# Patient Record
Sex: Male | Born: 1970
Health system: Southern US, Community
[De-identification: ages and names within clinical notes are randomized; demographics above are authoritative.]

---

## 2014-05-11 ENCOUNTER — Emergency Department: Payer: Self-pay | Admitting: Student

## 2018-09-22 ENCOUNTER — Emergency Department
Admission: EM | Admit: 2018-09-22 | Discharge: 2018-09-22 | Disposition: A | Discharge status: Home / Self Care | Payer: Managed Care, Other (non HMO) | Attending: Student in an Organized Health Care Education/Training Program | Admitting: Student in an Organized Health Care Education/Training Program

## 2018-09-22 ENCOUNTER — Other Ambulatory Visit: Payer: Self-pay

## 2018-09-22 ENCOUNTER — Encounter: Payer: Self-pay | Admitting: Emergency Medicine

## 2018-09-22 DIAGNOSIS — Y9368 Activity, volleyball (beach) (court): Secondary | ICD-10-CM | POA: Diagnosis not present

## 2018-09-22 DIAGNOSIS — Y929 Unspecified place or not applicable: Secondary | ICD-10-CM | POA: Insufficient documentation

## 2018-09-22 DIAGNOSIS — S8991XA Unspecified injury of right lower leg, initial encounter: Secondary | ICD-10-CM | POA: Diagnosis present

## 2018-09-22 DIAGNOSIS — S86911A Strain of unspecified muscle(s) and tendon(s) at lower leg level, right leg, initial encounter: Secondary | ICD-10-CM | POA: Diagnosis not present

## 2018-09-22 DIAGNOSIS — Y33XXXA Other specified events, undetermined intent, initial encounter: Secondary | ICD-10-CM | POA: Insufficient documentation

## 2018-09-22 DIAGNOSIS — Y998 Other external cause status: Secondary | ICD-10-CM | POA: Insufficient documentation

## 2018-09-22 MED ORDER — KETOROLAC TROMETHAMINE 60 MG/2ML IM SOLN
60.0000 mg | Freq: Once | INTRAMUSCULAR | Status: AC
  Administered 2018-09-22: 60 mg via INTRAMUSCULAR
  Filled 2018-09-22: qty 2

## 2018-09-22 MED ORDER — ORPHENADRINE CITRATE 30 MG/ML IJ SOLN
60.0000 mg | Freq: Two times a day (BID) | INTRAMUSCULAR | Status: DC
  Administered 2018-09-22: 60 mg via INTRAMUSCULAR
  Filled 2018-09-22: qty 2

## 2018-09-22 MED ORDER — OXYCODONE-ACETAMINOPHEN 7.5-325 MG PO TABS: 1.0000 | ORAL_TABLET | Freq: Four times a day (QID) | ORAL | 0 refills | Status: DC | PRN

## 2018-09-22 MED ORDER — HYDROMORPHONE HCL 1 MG/ML IJ SOLN
1.0000 mg | Freq: Once | INTRAMUSCULAR | Status: AC
  Administered 2018-09-22: 1 mg via INTRAMUSCULAR
  Filled 2018-09-22: qty 1

## 2018-09-22 MED ORDER — IBUPROFEN 600 MG PO TABS: 600.0000 mg | ORAL_TABLET | Freq: Three times a day (TID) | ORAL | 0 refills | Status: DC | PRN

## 2018-09-22 MED ORDER — CYCLOBENZAPRINE HCL 10 MG PO TABS: 10.0000 mg | ORAL_TABLET | Freq: Three times a day (TID) | ORAL | 0 refills | Status: DC | PRN

## 2018-09-22 NOTE — ED Triage Notes (Signed)
C/O left upper leg pain.  STates injured leg while playing volley ball yesterday.  STates pain worse when sitting.

## 2018-09-22 NOTE — ED Notes (Signed)

## 2018-09-22 NOTE — ED Notes (Signed)
See triage note  Presents with pain to left leg  Pain started while playing volley ball yesterday  Pain is mainly to upper leg  Pain increases with sitting  Ambulates well

## 2018-09-22 NOTE — ED Provider Notes (Signed)
[facility] Emergency Department Provider Note   ____________________________________________   First MD Initiated Contact with Patient 09/22/18 1043     (approximate)  I have reviewed the triage vital signs and the nursing notes.   HISTORY  Chief Complaint Leg Injury    HPI Stephen Weaver is a 48 y.o. male patient complain of posterior right thigh pain secondary to a hyperextension incident yesterday while playing volleyball.  Patient states pain increases with sitting.   Patient rates the pain a 6/10.  Patient described the pain as "achy/sore".  No palliative measures for complaint.     History reviewed. No pertinent past medical history.  There are no active problems to display for this patient.   History reviewed. No pertinent surgical history.  Prior to Admission medications   Medication Sig Start Date End Date Taking? Authorizing Provider  cyclobenzaprine (FLEXERIL) 10 MG tablet Take 1 tablet (10 mg total) by mouth 3 (three) times daily as needed. 09/22/18   Joni Reining, PA-C  ibuprofen (ADVIL,MOTRIN) 600 MG tablet Take 1 tablet (600 mg total) by mouth every 8 (eight) hours as needed. 09/22/18   Joni Reining, PA-C  oxyCODONE-acetaminophen (PERCOCET) 7.5-325 MG tablet Take 1 tablet by mouth every 6 (six) hours as needed. 09/22/18   Joni Reining, PA-C    Allergies Patient has no known allergies.  No family history on file.  Social History Social History   Tobacco Use  . Smoking status: Not on file  Substance Use Topics  . Alcohol use: Not on file  . Drug use: Not on file    Review of Systems Constitutional: No fever/chills Eyes: No visual changes. ENT: No sore throat. Cardiovascular: Denies chest pain. Respiratory: Denies shortness of breath. Gastrointestinal: No abdominal pain.  No nausea, no vomiting.  No diarrhea.  No constipation. Genitourinary: Negative for dysuria. Musculoskeletal: Right upper posterior leg  pain. Skin: Negative for rash. Neurological: Negative for headaches, focal weakness or numbness.   ____________________________________________   PHYSICAL EXAM:  VITAL SIGNS: ED Triage Vitals [09/22/18 0921]  Enc Vitals Group     BP      Pulse      Resp      Temp      Temp src      SpO2      Weight 160 lb (72.6 kg)     Height 5\' 5"  (1.651 m)     Head Circumference      Peak Flow      Pain Score 6     Pain Loc      Pain Edu?      Excl. in GC?     Constitutional: Alert and oriented. Well appearing and in no acute distress. Hematological/Lymphatic/Immunilogical: No cervical lymphadenopathy. Cardiovascular: Normal rate, regular rhythm. Grossly normal heart sounds.  Good peripheral circulation. Respiratory: Normal respiratory effort.  No retractions. Lungs CTAB. Musculoskeletal: Patient is moderate guarding palpation medial mid muscle area of the right thigh posteriorly.   Neurologic:  Normal speech and language. No gross focal neurologic deficits are appreciated. No gait instability. Skin:  Skin is warm, dry and intact. No rash noted. Psychiatric: Mood and affect are normal. Speech and behavior are normal.  ____________________________________________   LABS (all labs ordered are listed, but only abnormal results are displayed)  Labs Reviewed - No data to display ____________________________________________  EKG   ____________________________________________  RADIOLOGY  ED MD interpretation:    Official radiology report(s): No results found.  ____________________________________________  PROCEDURES  Procedure(s) performed: None  Procedures  Critical Care performed: No  ____________________________________________   INITIAL IMPRESSION / ASSESSMENT AND PLAN / ED COURSE  As part of my medical decision making, I reviewed the following data within the electronic MEDICAL RECORD NUMBER     Right posterior leg muscle strain secondary to hyperextension  incident.  Patient given discharge care instruction advised take medication as directed.  [facility]  Patient given a work note.      ____________________________________________   FINAL CLINICAL IMPRESSION(S) / ED DIAGNOSES  Final diagnoses:  Muscle strain of lower leg, right, initial encounter     ED Discharge Orders         Ordered    cyclobenzaprine (FLEXERIL) 10 MG tablet  3 times daily PRN     09/22/18 1158    oxyCODONE-acetaminophen (PERCOCET) 7.5-325 MG tablet  Every 6 hours PRN     09/22/18 1158    ibuprofen (ADVIL,MOTRIN) 600 MG tablet  Every 8 hours PRN     09/22/18 1158           Note:  This document was prepared using Dragon voice recognition software and may include unintentional dictation errors.    Joni Reining, PA-C 09/22/18 1214    Willy Eddy, MD 09/22/18 1254

## 2020-07-04 ENCOUNTER — Encounter: Payer: Self-pay | Admitting: Urology

## 2020-07-04 ENCOUNTER — Ambulatory Visit (INDEPENDENT_AMBULATORY_CARE_PROVIDER_SITE_OTHER): Payer: 59 | Admitting: Urology

## 2020-07-04 ENCOUNTER — Other Ambulatory Visit: Payer: Self-pay

## 2020-07-04 VITALS — BP 142/80 | HR 65 | Ht 65.0 in | Wt 171.4 lb

## 2020-07-04 DIAGNOSIS — Z3009 Encounter for other general counseling and advice on contraception: Secondary | ICD-10-CM | POA: Diagnosis not present

## 2020-07-04 MED ORDER — DIAZEPAM 5 MG PO TABS: 5.0000 mg | ORAL_TABLET | Freq: Once | ORAL | 0 refills | Status: DC | PRN

## 2020-07-04 NOTE — Patient Instructions (Signed)
Pre-Vasectomy Instructions  STOP all aspirin or blood thinners (Aspirin, Plavix, Coumadin, Warfarin, Motrin, Ibuprofen, Advil, Aleve, Naproxen, Naprosyn) for 7 days prior to the procedure.  If you have any questions about stopping these medications please contact your primary care physician or cardiologist.  Shave all hair from the upper scrotum on the day of the procedure.  This means just under the penis onto the scrotal sac.  The area shaved should measure about 2-3 inches around.  You may lather the scrotum with soap and water, and shave with a safety razor.  After shaving the area, thoroughly wash the penis and the scrotum, then shower or bathe to remove all the loose hairs.  If needed, wash the area again just before coming in for your circumcision.  It is recommended to have a light meal an hour or so prior to the procedure.  Bring a scrotal support (jock strap or suspensory, or tight jockey shorts or underwear).  Wear comfortable pants or shorts.  While the actual procedure usually takes about 45 minutes, you should be prepared to stay in the office for approximately one hour.  Bring someone with you to drive you home.  If you have any questions or concerns, please feel free to call the office at [phone].    Vasectomy Vasectomy is a procedure in which the tube that carries sperm from the testicle to the urethra (vas deferens) is tied. It may also be cut. The procedure blocks sperm from going through the vas deferens and penis during ejaculation. This ensures that sperm does not go into the vagina during sex. Vasectomy does not affect your sexual desire or performance, and does not prevent sexually transmitted diseases. Vasectomy is considered a permanent and very effective form of birth control (contraception). The decision to have a vasectomy should not be made during a stressful situation, such as after the loss of a pregnancy or a divorce. You and your partner should make the  decision to have a vasectomy when you are sure that you do not want children in the future. Tell a health care provider about:  Any allergies you have.  All medicines you are taking, including vitamins, herbs, eye drops, creams, and over-the-counter medicines.  Any problems you or family members have had with anesthetic medicines.  Any blood disorders you have.  Any surgeries you have had.  Any medical conditions you have. What are the risks? Generally, this is a safe procedure. However, problems may occur, including:  Infection.  Bleeding and swelling of the scrotum.  Allergic reactions to medicines.  Failure of the procedure to prevent pregnancy. There is a very small chance that the cut ends of the vas deferens may reconnect (recanalization), meaning that you could still make a woman pregnant.  Pain in the scrotum that continues after healing from the procedure. What happens before the procedure?  Ask your health care provider about: ? Changing or stopping your regular medicines. This is especially important if you are taking diabetes medicines or blood thinners. ? Taking over-the-counter medicines, vitamins, herbs, and supplements. ? Taking medicines such as aspirin and ibuprofen. These medicines can thin your blood. Do not take these medicines unless your health care provider tells you to take them.  You may be asked to shower with a germ-killing soap.  Plan to have someone take you home from the hospital or clinic. What happens during the procedure?   To lower your risk of infection: ? Your health care team will wash or  sanitize their hands. ? Hair may be removed from the surgical area. ? Your scrotum will be washed with soap.  You will be given one or more of the following: ? A medicine to help you relax (sedative). You may be instructed to take this a few hours before the procedure. ? A medicine to numb the area (local anesthetic).  Your health care provider  will feel (palpate) for your vas deferens.  To reach the vas deferens, one of two methods may be used: ? A very small incision may be made in your scrotum. ? A punctured opening may be made in your scrotum, without an incision.  Your vas deferens will be pulled out of your scrotum, and may be: ? Tied off. ? Cut and possibly burned (cauterized) at the ends to seal them off.  The vas deferens will be put back into your scrotum.  The incision or puncture opening will be closed with absorbable stitches (sutures). The sutures will eventually dissolve and will not need to be removed after the procedure. The procedure may vary among health care providers and hospitals. What happens after the procedure?  You will be monitored to make sure that you do not experience problems.  You will be asked not to ejaculate for at least 1 week after the procedure, or as long as directed.  You will need to use a different form of contraception for 2-4 months after the procedure, until you have test results confirming that there are no sperm in your semen.  You may be given scrotal support to wear, such as a jock strap or underwear with a supportive pouch.  Do not drive for 24 hours if you were given a sedative to help you relax. Summary  Vasectomy is considered a permanent and very effective form of birth control (contraception). The procedure prevents sperm from being released during ejaculation.  Your scrotum will be numbed with medicine (local anesthetic) for the procedure.  After the procedure, you will be asked not to ejaculate for at least 1 week, or for as long as directed. You will also need to use a different form of contraception until your health care provider examines you and finds that there are no sperm in your semen. This information is not intended to replace advice given to you by your health care provider. Make sure you discuss any questions you have with your health care  provider. Document Revised: 02/14/2018 Document Reviewed: 11/09/2016 Elsevier Patient Education  2020 [employer].

## 2020-07-04 NOTE — Progress Notes (Signed)
   07/04/20 3:57 PM   Stephen Weaver 02-17-1971 [id number]  CC: Discuss vasectomy  HPI: I saw Stephen Weaver and his wife in urology clinic for discussion of vasectomy.  He is a very healthy 49 year old male, and they have 2 kids, ages 50 and 24.  They do not desire any further biologic pregnancies.  He denies any gross hematuria or urinary symptoms.  He denies any family history of prostate cancer.   Social History:  reports that he has never smoked. He has never used smokeless tobacco. He reports previous alcohol use. He reports that he does not use drugs.  Physical Exam: BP (!) 142/80 (BP Location: Left Arm, Patient Position: Sitting, Cuff Size: Large)   Pulse 65   Ht 5\' 5"  (1.651 m)   Wt 171 lb 6.4 oz (77.7 kg)   BMI 28.52 kg/m    Constitutional:  Alert and oriented, No acute distress. Cardiovascular: No clubbing, cyanosis, or edema. Respiratory: Normal respiratory effort, no increased work of breathing. GI: Abdomen is soft, nontender, nondistended, no abdominal masses GU: Phallus with patent meatus, vas deferens easily palpable bilaterally, no testicular masses  Laboratory Data: None to review  Pertinent Imaging: None to review  Assessment & Plan:   Healthy 49 year old male with 2 children he does not desire any further biologic pregnancies and is interested in vasectomy.  We discussed the risks and benefits of vasectomy at length.  Vasectomy is intended to be a permanent form of contraception, and does not produce immediate sterility.  Following vasectomy another form of contraception is required until vas occlusion is confirmed by a post-vasectomy semen analysis obtained 2-3 months after the procedure.  Even after vas occlusion is confirmed, vasectomy is not 100% reliable in preventing pregnancy, and the failure rate is approximately 08/1998.  Repeat vasectomy is required in less than 1% of patients.  He should refrain from ejaculation for 1 week after vasectomy.  Options for  fertility after vasectomy include vasectomy reversal, and sperm retrieval with in vitro fertilization or ICSI.  These options are not always successful and may be expensive.  Finally, there are other permanent and non-permanent alternatives to vasectomy available. There is no risk of erectile dysfunction, and the volume of semen will be similar to prior, as the majority of the ejaculate is from the prostate and seminal vesicles.   The procedure takes ~20 minutes.  We recommend patients take 5-10 mg of Valium 30 minutes prior, and he will need a driver post-procedure.  Local anesthetic is injected into the scrotal skin and a small segment of the vas deferens is removed, and the ends occluded. The complication rate is approximately 1-2%, and includes bleeding, infection, and development of chronic scrotal pain.  PLAN: Pending insurance approval, will schedule for vasectomy at his convenience  Valium sent to pharmacy  09/1998, MD 07/04/2020  [facility] [address], [address] [place], [place] [phone]

## 2020-07-06 ENCOUNTER — Ambulatory Visit: Payer: Self-pay | Admitting: Urology

## 2020-07-14 ENCOUNTER — Other Ambulatory Visit: Payer: Self-pay

## 2020-07-14 ENCOUNTER — Ambulatory Visit (INDEPENDENT_AMBULATORY_CARE_PROVIDER_SITE_OTHER): Payer: 59 | Admitting: Urology

## 2020-07-14 ENCOUNTER — Encounter: Payer: Self-pay | Admitting: Urology

## 2020-07-14 VITALS — BP 129/83 | HR 60 | Ht 65.0 in | Wt 171.0 lb

## 2020-07-14 DIAGNOSIS — Z302 Encounter for sterilization: Secondary | ICD-10-CM | POA: Diagnosis not present

## 2020-07-14 HISTORY — PX: VASECTOMY: SHX75

## 2020-07-14 NOTE — Patient Instructions (Signed)
Vasectomy, Care After °This sheet gives you information about how to care for yourself after your procedure. Your health care provider may also give you more specific instructions. If you have problems or questions, contact your health care provider. °What can I expect after the procedure? °After your procedure, it is common to have: °· Mild pain, swelling, redness, or discomfort in your scrotum. °· Some blood coming from your incisions or puncture sites for one or two days. °· Blood in your semen. °Follow these instructions at home: °Medicines ° °· Take over-the-counter and prescription medicines only as told by your health care provider. °· Avoid taking NSAIDs such as aspirin and ibuprofen, because these medicines can make bleeding worse. °Activity °· For the first 2 days after surgery, avoid physical activity and exercise that require a lot of energy. Ask your health care provider what activities are safe for you. °· Do not participate in sports or perform heavy physical labor until your pain has improved, or until your health care provider says it is okay. °· Do not ejaculate for at least 1 week after the procedure, or as long as directed. °· You may resume sexual activity 7-10 days after your procedure, or when your health care provider approves. Use a different method of birth control (contraception) until you have had test results that confirm that there is no sperm in your semen. °Scrotal support °· Use scrotal support, such as a jock strap or underwear with a supportive pouch, as needed for one week after your procedure. °· If you feel discomfort in your scrotum, you may remove the scrotal support to see if the discomfort is relieved. Sometimes scrotal support can press on the scrotum and cause or worsen discomfort. °· If your skin gets irritated, you may add some germ-free (sterile), fluffed bandages or a clean washcloth to the scrotal support. °General instructions °· Put ice on the injured area: °? Put  ice in a plastic bag. °? Place a towel between your skin and the bag. °? Leave the ice on for 20 minutes, 2-3 times a day. °· Check your incisions or puncture sites every day for signs of infection. Check for: °? Redness, swelling, or pain. °? Fluid or blood. °? Warmth. °? Pus or a bad smell. °· Leave stitches (sutures) in place. The sutures will dissolve on their own and do not need to be removed. °· Keep all follow-up visits as told by your health care provider. This is important because you will need a test to confirm that there is no sperm in your semen. Multiple ejaculations are needed to clear out sperm that were beyond the vasectomy site. You will need one test result showing that there is no sperm in your semen before you can resume unprotected sex. This may take 2-4 months after your procedure. °· Do not drive for 24 hours if you were given a sedative to help you relax. °Contact a health care provider if: °· You have redness, swelling, or more pain around your incision or puncture site, or in your scrotum area in general. °· You have bleeding from your incision or puncture site. °· You have pus or a bad smell coming from your incision or puncture site. °· You have a fever. °· Your incision or puncture site opens up. °Get help right away if: °· You develop a rash. °· You have difficulty breathing. °Summary °· After your procedure it is common to have mild pain, swelling, redness, or discomfort in your scrotum. °·   Avoid physical activity and exercise that requires a lot of energy for the first 2 days after surgery. °· Put ice on the injured area. Leave the ice on for 20 minutes, 2-3 times a day. °· Do not drive for 24 hours if you were given a sedative to help you relax. °This information is not intended to replace advice given to you by your health care provider. Make sure you discuss any questions you have with your health care provider. °Document Revised: 07/26/2017 Document Reviewed: 11/09/2016 °Elsevier  Patient Education © 2020 Elsevier Inc. ° °

## 2020-07-14 NOTE — Progress Notes (Signed)
VASECTOMY PROCEDURE NOTE:  The patient was taken to the minor procedure room and placed in the supine position. His genitals were prepped and draped in the usual sterile fashion. The right vas deferens was brought up to the skin of the right upper scrotum. The skin overlying it was anesthetized with 1% lidocaine without epinephrine, anesthetic was also injected alongside the vas deferens in the direction of the inguinal canal. The no scalpel vasectomy instrument was used to make a small perforation in the scrotal skin. The vasectomy clamp was used to grasp the vas deferens. It was carefully dissected free from surrounding structures. A 1cm segment of the vas was removed, and the cut ends of the mucosa were cauterized. The vas deferens was returned to the scrotum. The skin incision was closed with a simple interrupted stitch of 4-0 chromic.  Attention was then turned to the left side. The left vasectomy was performed in the same exact fashion. There was minimal bleeding and I opted to place a figure of eight stitch on the proximal vas. Sterile dressings were placed over each incision. The patient tolerated the procedure well.  IMPRESSION/DIAGNOSIS: The patient is a 49 year old gentleman who underwent a vasectomy today. Post-procedure instructions were reviewed. I stressed the importance of continuing to use birth control until he provides a semen specimen more than 2 months from now that demonstrates azoospermia.  We discussed return precautions including fever over 101, significant bleeding or hematoma, or uncontrolled pain. I also stressed the importance of avoiding strenuous activity for one week, no sexual activity or ejaculations for 5 days, intermittent icing over the next 48 hours, and scrotal support.   PLAN: The patient will be advised of his semen analysis results when available.   Legrand Rams, MD 07/14/2020

## 2020-07-20 ENCOUNTER — Encounter: Payer: Self-pay | Admitting: Urology

## 2020-09-09 ENCOUNTER — Other Ambulatory Visit: Payer: Self-pay

## 2020-09-09 ENCOUNTER — Observation Stay
Admission: EM | Admit: 2020-09-09 | Discharge: 2020-09-10 | Disposition: A | Discharge status: Home / Self Care | Payer: 59 | Attending: Internal Medicine | Admitting: Internal Medicine

## 2020-09-09 ENCOUNTER — Observation Stay: Payer: 59

## 2020-09-09 ENCOUNTER — Emergency Department: Payer: 59

## 2020-09-09 DIAGNOSIS — J1282 Pneumonia due to coronavirus disease 2019: Secondary | ICD-10-CM | POA: Diagnosis present

## 2020-09-09 DIAGNOSIS — U071 COVID-19: Secondary | ICD-10-CM | POA: Diagnosis not present

## 2020-09-09 DIAGNOSIS — J9601 Acute respiratory failure with hypoxia: Secondary | ICD-10-CM | POA: Diagnosis not present

## 2020-09-09 LAB — COMPREHENSIVE METABOLIC PANEL
ALT: 222 U/L — ABNORMAL HIGH (ref 0–44)
AST: 133 U/L — ABNORMAL HIGH (ref 15–41)
Albumin: 3.5 g/dL (ref 3.5–5.0)
Alkaline Phosphatase: 131 U/L — ABNORMAL HIGH (ref 38–126)
Anion gap: 12 (ref 5–15)
BUN: 25 mg/dL — ABNORMAL HIGH (ref 6–20)
CO2: 22 mmol/L (ref 22–32)
Calcium: 9.6 mg/dL (ref 8.9–10.3)
Chloride: 104 mmol/L (ref 98–111)
Creatinine, Ser: 0.88 mg/dL (ref 0.61–1.24)
GFR, Estimated: >60 mL/min (ref >60)
Glucose, Bld: 144 mg/dL — ABNORMAL HIGH (ref 70–99)
Potassium: 4.8 mmol/L (ref 3.5–5.1)
Sodium: 138 mmol/L (ref 135–145)
Total Bilirubin: 0.6 mg/dL (ref 0.3–1.2)
Total Protein: 7.7 g/dL (ref 6.5–8.1)

## 2020-09-09 LAB — CBC WITH DIFFERENTIAL/PLATELET
Abs Immature Granulocytes: 0.13 10*3/uL — ABNORMAL HIGH (ref 0.00–0.07)
Basophils Absolute: 0 10*3/uL (ref 0.0–0.1)
Basophils Relative: 0 %
Eosinophils Absolute: 0 10*3/uL (ref 0.0–0.5)
Eosinophils Relative: 0 %
HCT: 44.3 % (ref 39.0–52.0)
Hemoglobin: 15.1 g/dL (ref 13.0–17.0)
Immature Granulocytes: 2 %
Lymphocytes Relative: 9 %
Lymphs Abs: 0.6 10*3/uL — ABNORMAL LOW (ref 0.7–4.0)
MCH: 31.9 pg (ref 26.0–34.0)
MCHC: 34.1 g/dL (ref 30.0–36.0)
MCV: 93.5 fL (ref 80.0–100.0)
Monocytes Absolute: 0.3 10*3/uL (ref 0.1–1.0)
Monocytes Relative: 5 %
Neutro Abs: 5.3 10*3/uL (ref 1.7–7.7)
Neutrophils Relative %: 84 %
Platelets: 619 10*3/uL — ABNORMAL HIGH (ref 150–400)
RBC: 4.74 MIL/uL (ref 4.22–5.81)
RDW: 12.5 % (ref 11.5–15.5)
WBC: 6.3 10*3/uL (ref 4.0–10.5)
nRBC: 0 % (ref 0.0–0.2)

## 2020-09-09 LAB — TRIGLYCERIDES: Triglycerides: 167 mg/dL — ABNORMAL HIGH (ref <150)

## 2020-09-09 LAB — LACTATE DEHYDROGENASE: LDH: 298 U/L — ABNORMAL HIGH (ref 98–192)

## 2020-09-09 LAB — TROPONIN I (HIGH SENSITIVITY)
Troponin I (High Sensitivity): 9 ng/L (ref <18)
Troponin I (High Sensitivity): 9 ng/L (ref <18)

## 2020-09-09 LAB — FIBRIN DERIVATIVES D-DIMER (ARMC ONLY): Fibrin derivatives D-dimer (ARMC): 536.36 ng/mL (FEU) — ABNORMAL HIGH (ref 0.00–499.00)

## 2020-09-09 LAB — FERRITIN: Ferritin: 2750 ng/mL — ABNORMAL HIGH (ref 24–336)

## 2020-09-09 LAB — LACTIC ACID, PLASMA: Lactic Acid, Venous: 1.5 mmol/L (ref 0.5–1.9)

## 2020-09-09 LAB — PROCALCITONIN: Procalcitonin: <0.1 ng/mL

## 2020-09-09 LAB — FIBRINOGEN: Fibrinogen: >750 mg/dL — ABNORMAL HIGH (ref 210–475)

## 2020-09-09 MED ORDER — METHYLPREDNISOLONE SODIUM SUCC 125 MG IJ SOLR
1.0000 mg/kg | Freq: Two times a day (BID) | INTRAMUSCULAR | Status: DC
  Administered 2020-09-10: 10:00:00 79.375 mg via INTRAVENOUS
  Filled 2020-09-09: qty 2

## 2020-09-09 MED ORDER — SODIUM CHLORIDE 0.9 % IV SOLN
100.0000 mg | Freq: Every day | INTRAVENOUS | Status: DC
  Administered 2020-09-10: 100 mg via INTRAVENOUS
  Filled 2020-09-09: qty 20

## 2020-09-09 MED ORDER — ONDANSETRON HCL 4 MG PO TABS: 4.0000 mg | ORAL_TABLET | Freq: Four times a day (QID) | ORAL | Status: DC | PRN

## 2020-09-09 MED ORDER — GUAIFENESIN-DM 100-10 MG/5ML PO SYRP
10.0000 mL | ORAL_SOLUTION | ORAL | Status: DC | PRN
Start: 2020-09-09 — End: 2020-09-10

## 2020-09-09 MED ORDER — ENOXAPARIN SODIUM 40 MG/0.4ML ~~LOC~~ SOLN
40.0000 mg | SUBCUTANEOUS | Status: DC
  Administered 2020-09-09: 40 mg via SUBCUTANEOUS
  Filled 2020-09-09: qty 0.4

## 2020-09-09 MED ORDER — HYDROCOD POLST-CPM POLST ER 10-8 MG/5ML PO SUER: 5.0000 mL | Freq: Two times a day (BID) | ORAL | Status: DC | PRN

## 2020-09-09 MED ORDER — IOHEXOL 350 MG/ML SOLN
100.0000 mL | Freq: Once | INTRAVENOUS | Status: AC | PRN
  Administered 2020-09-09: 100 mL via INTRAVENOUS
  Filled 2020-09-09: qty 100

## 2020-09-09 MED ORDER — DEXAMETHASONE SODIUM PHOSPHATE 10 MG/ML IJ SOLN
6.0000 mg | Freq: Once | INTRAMUSCULAR | Status: AC
  Administered 2020-09-09: 6 mg via INTRAVENOUS
  Filled 2020-09-09: qty 1

## 2020-09-09 MED ORDER — SODIUM CHLORIDE 0.9 % IV SOLN
200.0000 mg | Freq: Once | INTRAVENOUS | Status: AC
  Administered 2020-09-09: 200 mg via INTRAVENOUS
  Filled 2020-09-09: qty 200

## 2020-09-09 MED ORDER — ACETAMINOPHEN 325 MG PO TABS: 650.0000 mg | ORAL_TABLET | Freq: Four times a day (QID) | ORAL | Status: DC | PRN

## 2020-09-09 MED ORDER — ONDANSETRON HCL 4 MG/2ML IJ SOLN: 4.0000 mg | Freq: Four times a day (QID) | INTRAMUSCULAR | Status: DC | PRN

## 2020-09-09 MED ORDER — PREDNISONE 50 MG PO TABS: 50.0000 mg | ORAL_TABLET | Freq: Every day | ORAL | Status: DC

## 2020-09-09 MED ORDER — ALBUTEROL SULFATE HFA 108 (90 BASE) MCG/ACT IN AERS
2.0000 | INHALATION_SPRAY | Freq: Four times a day (QID) | RESPIRATORY_TRACT | Status: DC
  Administered 2020-09-10: 10:00:00 2 via RESPIRATORY_TRACT
  Filled 2020-09-09: qty 6.7

## 2020-09-09 MED ORDER — IBUPROFEN 400 MG PO TABS
400.0000 mg | ORAL_TABLET | Freq: Once | ORAL | Status: AC
  Administered 2020-09-09: 400 mg via ORAL
  Filled 2020-09-09: qty 1

## 2020-09-09 MED ORDER — MELATONIN 3 MG PO TABS
3.0000 mg | ORAL_TABLET | Freq: Every day | ORAL | Status: DC
  Administered 2020-09-09: 3 mg via ORAL
  Filled 2020-09-09: qty 1

## 2020-09-09 NOTE — ED Triage Notes (Signed)
Pt here due to COVID X 10 days with no symptom improvement. Headache and fever.

## 2020-09-09 NOTE — ED Notes (Signed)
Patient given sandwich tray. 

## 2020-09-09 NOTE — Consult Note (Signed)
Remdesivir - Pharmacy Brief Note   O:  ALT: 222 ** slightly above 5x ULN -- will monitor for now CXR: Moderate severity bilateral multifocal infiltrates. SpO2: 92% on RA   A/P:  Remdesivir 200 mg IVPB once followed by 100 mg IVPB daily x 4 days. ALT slightly above 5X ULN cut off for intiate. Will initiate and monitor trend in LFTs  Sharen Hones, PharmD, BCPS Clinical Pharmacist  09/09/2020 7:24 PM

## 2020-09-09 NOTE — ED Provider Notes (Signed)
[facility] Emergency Department Provider Note  ____________________________________________   Event Date/Time   First MD Initiated Contact with Patient 09/09/20 1646     (approximate)  I have reviewed the triage vital signs and the nursing notes.   HISTORY  Chief Complaint Covid Positive    HPI Stephen Weaver is a 50 y.o. male presents to the emergency department with covid symptoms for 10 days.   Is complaining of cough, congestion, fever, chills, chest pain, shortness of breath positive close contact with Covid19+ patient, patient is not vaccinated.  Patient states he tested positive [facility].  States he had to go back as he was getting worse as his oxygen had dropped down into the low 90s they gave him doxycycline, prednisone, albuterol inhaler, and budesonide.  Patient states he had a little relief with this but is still very short of breath.  Still having some low-grade fevers at night.  His wife states that he has been very short of breath and winded when he tries to walk across the room.  States she has never seen him this sick before.   History reviewed. No pertinent past medical history.  There are no problems to display for this patient.   Past Surgical History:  Procedure Laterality Date  . VASECTOMY Bilateral 07/14/2020    Prior to Admission medications   Medication Sig Start Date End Date Taking? Authorizing Provider  diazepam (VALIUM) 5 MG tablet Take 1 tablet (5 mg total) by mouth once as needed for up to 1 dose (take 30 minutes prior to vasectomy). 07/04/20   Sondra Come, MD    Allergies Patient has no known allergies.  No family history on file.  Social History Social History   Tobacco Use  . Smoking status: Never Smoker  . Smokeless tobacco: Never Used  Substance Use Topics  . Alcohol use: Not Currently  . Drug use: Never    Review of Systems  Constitutional: Positive fever/chills Eyes: No visual  changes. ENT: Denies sore throat. Respiratory: Positive cough Cardiovascular: Positive for chest pain Gastrointestinal: Denies abdominal pain, nausea, vomiting, diarrhea Genitourinary: Negative for dysuria. Musculoskeletal: Negative for back pain. Skin: Negative for rash. Neuro: Denies any changes in his neurologic status    ____________________________________________   PHYSICAL EXAM:  VITAL SIGNS: ED Triage Vitals [09/09/20 1644]  Enc Vitals Group     BP (!) 155/97     Pulse Rate 81     Resp 16     Temp 98.2 F (36.8 C)     Temp Source Oral     SpO2 92 %     Weight 175 lb (79.4 kg)     Height 5\' 5"  (1.651 m)     Head Circumference      Peak Flow      Pain Score 5     Pain Loc      Pain Edu?      Excl. in GC?     Constitutional: Alert and oriented. Well appearing and in no acute distress. Eyes: Conjunctivae are normal.  Head: Atraumatic. Nose: No congestion/rhinnorhea. Mouth/Throat: Mucous membranes are moist.  Neck:  supple no lymphadenopathy noted Cardiovascular: Normal rate, regular rhythm. Heart sounds are normal Respiratory: Normal respiratory effort.  No retractions, lungs CTA, patient becomes short of breath while talking, O2 saturation was 92% on room air GU: deferred Musculoskeletal: FROM all extremities, warm and well perfused Neurologic:  Normal speech and language.  Skin:  Skin is warm, dry  and intact. No rash noted. Psychiatric: Mood and affect are normal. Speech and behavior are normal.  ____________________________________________   LABS (all labs ordered are listed, but only abnormal results are displayed)  Labs Reviewed  COMPREHENSIVE METABOLIC PANEL - Abnormal; Notable for the following components:      Result Value   Glucose, Bld 144 (*)    BUN 25 (*)    AST 133 (*)    ALT 222 (*)    Alkaline Phosphatase 131 (*)    All other components within normal limits  CBC WITH DIFFERENTIAL/PLATELET - Abnormal; Notable for the following  components:   Platelets 619 (*)    Lymphs Abs 0.6 (*)    Abs Immature Granulocytes 0.13 (*)    All other components within normal limits  FIBRIN DERIVATIVES D-DIMER ([facility] ONLY) - Abnormal; Notable for the following components:   Fibrin derivatives D-dimer ([facility]) 536.36 (*)    All other components within normal limits  LACTIC ACID, PLASMA  PROCALCITONIN  LACTATE DEHYDROGENASE  FERRITIN  TRIGLYCERIDES  FIBRINOGEN  C-REACTIVE PROTEIN  TROPONIN I (HIGH SENSITIVITY)  TROPONIN I (HIGH SENSITIVITY)   ____________________________________________   ____________________________________________  RADIOLOGY  Chest x-ray  ____________________________________________   PROCEDURES  Procedure(s) performed: No  .Critical Care Performed by: Faythe Ghee, PA-C Authorized by: Faythe Ghee, PA-C   Critical care provider statement:    Critical care time (minutes):  45   Critical care time was exclusive of:  Separately billable procedures and treating other patients   Critical care was necessary to treat or prevent imminent or life-threatening deterioration of the following conditions:  Respiratory failure   Critical care was time spent personally by me on the following activities:  Discussions with consultants, evaluation of patient's response to treatment, examination of patient, ordering and performing treatments and interventions, ordering and review of laboratory studies, ordering and review of radiographic studies, pulse oximetry, re-evaluation of patient's condition, obtaining history from patient or surrogate and review of old charts      ____________________________________________   INITIAL IMPRESSION / ASSESSMENT AND PLAN / ED COURSE  Pertinent labs & imaging results that were available during my care of the patient were reviewed by me and considered in my medical decision making (see chart for details).   Patient is a 50 year old male who is unvaccinated for COVID who  complains of covid symptoms for 10 days.  Exam is consistent with covid.  See HPI.  Physical exam shows patient to be winded while talking.  O2 saturation was 92% on room air while sitting.  Lungs are clear to auscultation.  DDx: COVID, COVID-pneumonia, myocarditis, PE  CBC, metabolic panel, troponin, lactic acid, D-dimer, chest x-ray, EKG  We will also order an ambulatory pulse ox  Chest x-ray shows COVID-pneumonia.  Awaiting radiology results, radiology agrees  CBC shows elevated platelets, metabolic panel shows increased liver enzymes, troponin and lactic are normal, procalcitonin is normal, D-dimer is elevated at 536.  Discussed with Dr. Katrinka Blazing.  He would like to have a CT for PE.  With ambulation the patient's oxygen dropped to 88% on room air.  He does become hypoxic and short of breath with walking.  Paged hospitalist.  Hospitalist to admit.  Family was notified.       Stephen Weaver was evaluated in Emergency Department on 09/09/2020 for the symptoms described in the history of present illness. He was evaluated in the context of the global COVID-19 pandemic, which necessitated consideration that the patient might be at  risk for infection with the SARS-CoV-2 virus that causes COVID-19. Institutional protocols and algorithms that pertain to the evaluation of patients at risk for COVID-19 are in a state of rapid change based on information released by regulatory bodies including the CDC and federal and state organizations. These policies and algorithms were followed during the patient's care in the ED.   As part of my medical decision making, I reviewed the following data within the electronic MEDICAL RECORD NUMBER History obtained from family, Nursing notes reviewed and incorporated, Labs reviewed , EKG interpreted NSR see physician right, Old chart reviewed, Radiograph reviewed , Discussed with admitting physician Dr. Sedalia Muta, Notes from prior ED visits and [place] Controlled Substance  Database  ____________________________________________   FINAL CLINICAL IMPRESSION(S) / ED DIAGNOSES  Final diagnoses:  Pneumonia due to COVID-19 virus  Acute respiratory failure with hypoxia (HCC)      NEW MEDICATIONS STARTED DURING THIS VISIT:  New Prescriptions   No medications on file     Note:  This document was prepared using Dragon voice recognition software and may include unintentional dictation errors.    Faythe Ghee, PA-C 09/09/20 1922    Gilles Chiquito, MD 09/09/20 2036

## 2020-09-09 NOTE — ED Notes (Signed)
See triage note. Pt reports his SOB has been a bit better today compared to last few days. C/o HA. Resp reg/unlabored currently. Skin dry. Dry cough present.

## 2020-09-09 NOTE — ED Notes (Signed)
All extra tubes requested by lab/ordered by EDP Katrinka Blazing collected and sent to lab.

## 2020-09-09 NOTE — ED Notes (Signed)
Pt NSR on monitor

## 2020-09-09 NOTE — ED Notes (Signed)
EKG to EDP Smith.

## 2020-09-09 NOTE — ED Notes (Signed)
EKG to attending provider at bedside.

## 2020-09-09 NOTE — H&P (Signed)
History and Physical   Stephen Weaver YKD:[id number] DOB: 1970/11/23 DOA: 09/09/2020  PCP: Debbra Riding, PA Outpatient Specialists: none Patient coming from: Home  I have personally briefly reviewed patient's old medical records in [facility] EMR.  Chief Concern: Worsening shortness of breath  HPI: Stephen Weaver is a 50 y.o. male with medical history significant for no previous diagnosis and not prescribed any medications, presents emergency department from home for concerns of worsening shortness of breath.  He states he developed symptoms August 28, 2020. T max at home was 103, he alternated between ibuprofen and acetaminophen which improved his fever. He reports fever resolved. He endorsed prior diarrhea that has now resolved. He endorses insomnia due to nightmares for two nights. He reports that he is unvaccinated for covid 19. He endorses sick contacts in the family.  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 Everyone at home tested positive. He denies tobacco, etoh, recreational drugs.   ROS: Constitutional: no weight change, no fever ENT/Mouth: no sore throat, no rhinorrhea Eyes: no eye pain, no vision changes Cardiovascular: no chest pain, no dyspnea,  no edema, no palpitations Respiratory: no cough, no sputum, no wheezing Gastrointestinal: no nausea, no vomiting, no diarrhea, no constipation Genitourinary: no urinary incontinence, no dysuria, no hematuria Musculoskeletal: no arthralgias, no myalgias Skin: no skin lesions, no pruritus Neuro: + weakness, no loss of consciousness, no syncope Psych: no anxiety, no depression, + decrease appetite Heme/Lymph: no bruising, no bleeding  ED Course: Discussed with ED provider, patient requiring hospitalization due to worsening COVID-19 infection.  Assessment/Plan  Active Problems:   Pneumonia due to COVID-19 virus   COVID-19 pneumonia - Remdesivir per ED provider, query utilization of  remdesivir at this point as per PCP documentation patient developed symptoms late December 2021 - Steroids - Antitussives - Start 2 L nasal cannula oxygen therapy - Daily labs: CRP, D-dimer, CMP, CBC  Supportive care: Ondansetron, as needed oxygen, acetaminophen, antitussives - Encourage p.o. intake  Chart reviewed.   DVT prophylaxis: Enoxaparin Code Status: Full code Diet: Regular diet Family Communication: No Disposition Plan: Pending clinical course Consults called: Not indicated Admission status: Observation with telemetry  History reviewed. No pertinent past medical history.  Past Surgical History:  Procedure Laterality Date  . VASECTOMY Bilateral 07/14/2020   Social History:  reports that he has never smoked. He has never used smokeless tobacco. He reports previous alcohol use. He reports that he does not use drugs.  No Known Allergies No family history on file. Family history: Family history reviewed and not pertinent  Prior to Admission medications   Medication Sig Start Date End Date Taking? Authorizing Provider  diazepam (VALIUM) 5 MG tablet Take 1 tablet (5 mg total) by mouth once as needed for up to 1 dose (take 30 minutes prior to vasectomy). 07/04/20   Sondra Come, MD   Physical Exam: Vitals:   09/09/20 1845 09/09/20 1915 09/09/20 1918 09/09/20 1925  BP:      Pulse: 70 (!) 58 73 65  Resp: 20 15 (!) 25   Temp:      TempSrc:      SpO2: 96%   93%  Weight:      Height:       Constitutional: appears age-appropriate, NAD, calm, comfortable Eyes: PERRL, lids and conjunctivae normal ENMT: Mucous membranes are moist. Posterior pharynx clear of any exudate or lesions. Age-appropriate dentition. Hearing appropriate Neck: normal, supple, no masses, no thyromegaly Respiratory:  clear to auscultation bilaterally, no wheezing, no crackles. Mild increase respiratory effort. Some use of accessory muscle use.  Cardiovascular: Regular rate and rhythm, no murmurs /  rubs / gallops. No extremity edema. 2+ pedal pulses. No carotid bruits.  Abdomen: no tenderness, no masses palpated, no hepatosplenomegaly. Bowel sounds positive.  Musculoskeletal: no clubbing / cyanosis. No joint deformity upper and lower extremities. Good ROM, no contractures, no atrophy. Normal muscle tone.  Skin: no rashes, lesions, ulcers. No induration Neurologic: Sensation intact. Strength 5/5 in all 4.  Psychiatric: Normal judgment and insight. Alert and oriented x 3. Normal mood.   EKG: independently reviewed, showing normal sinus rhythm with a rate 65, QTc 453  Chest x-ray on Admission: I personally reviewed and I agree with radiologist reading as below.  DG Chest Portable 1 View  Result Date: 09/09/2020 CLINICAL DATA:  COVID positive with headache and fever. EXAM: PORTABLE CHEST 1 VIEW COMPARISON:  None. FINDINGS: Moderate severity multifocal infiltrates are seen within the mid and lower lung fields, bilaterally. There is no evidence of a pleural effusion or pneumothorax. The heart size and mediastinal contours are within normal limits. The visualized skeletal structures are unremarkable. IMPRESSION: Moderate severity bilateral multifocal infiltrates. Electronically Signed   By: Aram Candela M.D.   On: 09/09/2020 17:19   Labs on Admission: I have personally reviewed following labs  CBC: Recent Labs  Lab 09/09/20 1647  WBC 6.3  NEUTROABS 5.3  HGB 15.1  HCT 44.3  MCV 93.5  PLT 619*   Basic Metabolic Panel: Recent Labs  Lab 09/09/20 1647  NA 138  K 4.8  CL 104  CO2 22  GLUCOSE 144*  BUN 25*  CREATININE 0.88  CALCIUM 9.6   GFR: Estimated Creatinine Clearance: 97.6 mL/min (by C-G formula based on SCr of 0.88 mg/dL).  Liver Function Tests: Recent Labs  Lab 09/09/20 1647  AST 133*  ALT 222*  ALKPHOS 131*  BILITOT 0.6  PROT 7.7  ALBUMIN 3.5   Dequon Schnebly N Perez Dirico D.O. [facility]  If 7PM-7AM, please contact overnight-coverage provider If 7AM-7PM, please  contact day coverage provider [email or web address]  09/09/2020, 7:31 PM

## 2020-09-10 ENCOUNTER — Other Ambulatory Visit: Payer: Self-pay

## 2020-09-10 DIAGNOSIS — J1282 Pneumonia due to coronavirus disease 2019: Secondary | ICD-10-CM | POA: Diagnosis not present

## 2020-09-10 DIAGNOSIS — U071 COVID-19: Secondary | ICD-10-CM | POA: Diagnosis not present

## 2020-09-10 LAB — COMPREHENSIVE METABOLIC PANEL
ALT: 190 U/L — ABNORMAL HIGH (ref 0–44)
AST: 76 U/L — ABNORMAL HIGH (ref 15–41)
Albumin: 3.3 g/dL — ABNORMAL LOW (ref 3.5–5.0)
Alkaline Phosphatase: 120 U/L (ref 38–126)
Anion gap: 11 (ref 5–15)
BUN: 27 mg/dL — ABNORMAL HIGH (ref 6–20)
CO2: 25 mmol/L (ref 22–32)
Calcium: 9.8 mg/dL (ref 8.9–10.3)
Chloride: 104 mmol/L (ref 98–111)
Creatinine, Ser: 0.85 mg/dL (ref 0.61–1.24)
GFR, Estimated: >60 mL/min (ref >60)
Glucose, Bld: 123 mg/dL — ABNORMAL HIGH (ref 70–99)
Potassium: 4.9 mmol/L (ref 3.5–5.1)
Sodium: 140 mmol/L (ref 135–145)
Total Bilirubin: 0.5 mg/dL (ref 0.3–1.2)
Total Protein: 7.3 g/dL (ref 6.5–8.1)

## 2020-09-10 LAB — RESP PANEL BY RT-PCR (FLU A&B, COVID) ARPGX2
Influenza A by PCR: NEGATIVE
Influenza B by PCR: NEGATIVE
SARS Coronavirus 2 by RT PCR: POSITIVE — AB

## 2020-09-10 LAB — HIV ANTIBODY (ROUTINE TESTING W REFLEX): HIV Screen 4th Generation wRfx: NONREACTIVE

## 2020-09-10 LAB — FIBRIN DERIVATIVES D-DIMER (ARMC ONLY): Fibrin derivatives D-dimer (ARMC): 486.62 ng/mL (FEU) (ref 0.00–499.00)

## 2020-09-10 LAB — C-REACTIVE PROTEIN
CRP: 3 mg/dL — ABNORMAL HIGH (ref <1.0)
CRP: 2.2 mg/dL — ABNORMAL HIGH (ref <1.0)

## 2020-09-10 MED ORDER — PREDNISONE 10 MG PO TABS
60.0000 mg | ORAL_TABLET | Freq: Every day | ORAL | 0 refills | Status: AC
Start: 2020-09-10 — End: ?

## 2020-09-10 MED ORDER — ALBUTEROL SULFATE HFA 108 (90 BASE) MCG/ACT IN AERS: 2.0000 | INHALATION_SPRAY | Freq: Four times a day (QID) | RESPIRATORY_TRACT | 1 refills | Status: AC

## 2020-09-10 MED ORDER — GUAIFENESIN-DM 100-10 MG/5ML PO SYRP: 10.0000 mL | ORAL_SOLUTION | ORAL | 0 refills | Status: AC | PRN

## 2020-09-10 NOTE — Discharge Summary (Addendum)
[facility] [place] [facility]   PATIENT NAME: Stephen Weaver    MR#:  [id number]  DATE OF BIRTH:  06/11/1971  DATE OF ADMISSION:  09/09/2020 ADMITTING PHYSICIAN: Amy N Cox, DO  DATE OF DISCHARGE: 09/10/2020  PRIMARY CARE PHYSICIAN: Patient, No Pcp Per    ADMISSION DIAGNOSIS:  Acute respiratory failure with hypoxia (HCC) [J96.01] Pneumonia due to COVID-19 virus [U07.1, J12.82]  DISCHARGE DIAGNOSIS:  Pneumonia due to COVID-19  SECONDARY DIAGNOSIS:  History reviewed. No pertinent past medical history.  HOSPITAL COURSE:  Stephen Weaver is a 50 y.o. male with medical history significant for no previous diagnosis and not prescribed any medications, presents emergency department from home for concerns of worsening shortness of breath.  COVID-19 pneumonia - Remdesivir per ED provider, query utilization of remdesivir at this point as per PCP documentation patient developed symptoms late December 2021 --pt received 2 doses--he is schedule to get remaining as out pt -sats >90% on RA - Steroids - Antitussives - pt is requesting to go home. He is anxious and not able to sleep in the hospital. Worried about the weather and does not want to be stuck in the hospital also. --Explained to him that given PNA and symptoms and he being started on remdesivir he can finish remaining doses as out pt. He voiced understanding.  Supportive care: incentive spirometer and flutter valve, acetaminophen, antitussives - Encourage p.o. intake    DVT prophylaxis: Enoxaparin Code Status: Full code Diet: Regular diet Family Communication: No Disposition Plan: home today Consults called: Not indicated Admission status: Observation    D/c home CONSULTS OBTAINED:    DRUG ALLERGIES:  No Known Allergies  DISCHARGE MEDICATIONS:   Allergies as of 09/10/2020   No Known Allergies     Medication List    STOP taking these medications   albuterol (2.5 MG/3ML) 0.083%  nebulizer solution Commonly known as: PROVENTIL Replaced by: albuterol 108 (90 Base) MCG/ACT inhaler   diazepam 5 MG tablet Commonly known as: Valium     TAKE these medications   albuterol 108 (90 Base) MCG/ACT inhaler Commonly known as: VENTOLIN HFA Inhale 2 puffs into the lungs every 6 (six) hours. Replaces: albuterol (2.5 MG/3ML) 0.083% nebulizer solution   budesonide 0.5 MG/2ML nebulizer solution Commonly known as: PULMICORT Take 2 mLs by nebulization daily.   doxycycline 100 MG tablet Commonly known as: VIBRA-TABS Take 100 mg by mouth 2 (two) times daily.   guaiFENesin-dextromethorphan 100-10 MG/5ML syrup Commonly known as: ROBITUSSIN DM Take 10 mLs by mouth every 4 (four) hours as needed for cough.   predniSONE 10 MG tablet Commonly known as: DELTASONE Take 6 tablets (60 mg total) by mouth daily with breakfast. Take 60 mg daily--taper by 10 mg daily then stop What changed:   medication strength  how much to take  when to take this  additional instructions       If you experience worsening of your admission symptoms, develop shortness of breath, life threatening emergency, suicidal or homicidal thoughts you must seek medical attention immediately by calling 911 or calling your MD immediately  if symptoms less severe.  You Must read complete instructions/literature along with all the possible adverse reactions/side effects for all the Medicines you take and that have been prescribed to you. Take any new Medicines after you have completely understood and accept all the possible adverse reactions/side effects.   Please note  You were cared for by a hospitalist during your hospital stay. If you have any  questions about your discharge medications or the care you received while you were in the hospital after you are discharged, you can call the unit and asked to speak with the hospitalist on call if the hospitalist that took care of you is not available. Once you are  discharged, your primary care physician will handle any further medical issues. Please note that NO REFILLS for any discharge medications will be authorized once you are discharged, as it is imperative that you return to your primary care physician (or establish a relationship with a primary care physician if you do not have one) for your aftercare needs so that they can reassess your need for medications and monitor your lab values. Today   SUBJECTIVE   I came her to check my body out--dx with COVID 08/28/20 Requesting to go home. Unable to sleep and dont want to be home today Breathing better sats >90% on RA  VITAL SIGNS:  Blood pressure 130/89, pulse 72, temperature 97.7 F (36.5 C), temperature source Oral, resp. rate (!) 21, height 5\' 5"  (1.651 m), weight 79.4 kg, SpO2 90 %.  I/O:    Intake/Output Summary (Last 24 hours) at 09/10/2020 1232 Last data filed at 09/10/2020 1103 Gross per 24 hour  Intake 107.72 ml  Output -  Net 107.72 ml    PHYSICAL EXAMINATION:  GENERAL:  50 y.o.-year-old patient lying in the bed with no acute distress.  LUNGS: Normal breath sounds bilaterally, no wheezing, rales,rhonchi or crepitation. No use of accessory muscles of respiration.  CARDIOVASCULAR: S1, S2 normal. No murmurs, rubs, or gallops.  ABDOMEN: Soft, non-tender, non-distended. Bowel sounds present. No organomegaly or mass.  EXTREMITIES: No pedal edema, cyanosis, or clubbing.  NEUROLOGIC: Cranial nerves II through XII are intact. Muscle strength 5/5 in all extremities. Sensation intact. Gait not checked.  PSYCHIATRIC: The patient is alert and oriented x 3.  SKIN: No obvious rash, lesion, or ulcer.   DATA REVIEW:   CBC  Recent Labs  Lab 09/09/20 1647  WBC 6.3  HGB 15.1  HCT 44.3  PLT 619*    Chemistries  Recent Labs  Lab 09/10/20 0502  NA 140  K 4.9  CL 104  CO2 25  GLUCOSE 123*  BUN 27*  CREATININE 0.85  CALCIUM 9.8  AST 76*  ALT 190*  ALKPHOS 120  BILITOT 0.5     Microbiology Results   Recent Results (from the past 240 hour(s))  Resp Panel by RT-PCR (Flu A&B, Covid) Nasopharyngeal Swab     Status: Abnormal   Collection Time: 09/09/20 10:59 PM   Specimen: Nasopharyngeal Swab; Nasopharyngeal(NP) swabs in vial transport medium  Result Value Ref Range Status   SARS Coronavirus 2 by RT PCR POSITIVE (A) NEGATIVE Final    Comment: CASSIE FELTS RN 09/10/2020 0057 JG (NOTE) SARS-CoV-2 target nucleic acids are DETECTED.  The SARS-CoV-2 RNA is generally detectable in upper respiratory specimens during the acute phase of infection. Positive results are indicative of the presence of the identified virus, but do not rule out bacterial infection or co-infection with other pathogens not detected by the test. Clinical correlation with patient history and other diagnostic information is necessary to determine patient infection status. The expected result is Negative.  Fact Sheet for Patients: 09/12/2020  Fact Sheet for Healthcare Providers: BloggerCourse.com  This test is not yet approved or cleared by the [place] FDA and  has been authorized for detection and/or diagnosis of SARS-CoV-2 by FDA under an Emergency Use Authorization (EUA).  This  EUA will remain in effect (meaning this test can be used) for the duration of  the COVID-19 declarati on under Section 564(b)(1) of the Act, 21 U.S.C. section 360bbb-3(b)(1), unless the authorization is terminated or revoked sooner.     Influenza A by PCR NEGATIVE NEGATIVE Final   Influenza B by PCR NEGATIVE NEGATIVE Final    Comment: (NOTE) The Xpert Xpress SARS-CoV-2/FLU/RSV plus assay is intended as an aid in the diagnosis of influenza from Nasopharyngeal swab specimens and should not be used as a sole basis for treatment. Nasal washings and aspirates are unacceptable for Xpert Xpress SARS-CoV-2/FLU/RSV testing.  Fact Sheet for  Patients: BloggerCourse.comhttps://www.fda.gov/media/152166/download  Fact Sheet for Healthcare Providers: SeriousBroker.ithttps://www.fda.gov/media/152162/download  This test is not yet approved or cleared by the [place] FDA and has been authorized for detection and/or diagnosis of SARS-CoV-2 by FDA under an Emergency Use Authorization (EUA). This EUA will remain in effect (meaning this test can be used) for the duration of the COVID-19 declaration under Section 564(b)(1) of the Act, 21 U.S.C. section 360bbb-3(b)(1), unless the authorization is terminated or revoked.  Performed at [facility], [address] [place], [place] [address]     RADIOLOGY:  CT Angio Chest PE W and/or Wo Contrast  Result Date: 09/09/2020 CLINICAL DATA:  COVID-19 pneumonia. EXAM: CT ANGIOGRAPHY CHEST WITH CONTRAST TECHNIQUE: Multidetector CT imaging of the chest was performed using the standard protocol during bolus administration of intravenous contrast. Multiplanar CT image reconstructions and MIPs were obtained to evaluate the vascular anatomy. CONTRAST:  100mL OMNIPAQUE IOHEXOL 350 MG/ML SOLN COMPARISON:  Radiograph of same day. FINDINGS: Cardiovascular: Satisfactory opacification of the pulmonary arteries to the segmental level. No evidence of pulmonary embolism. Normal heart size. No pericardial effusion. Mediastinum/Nodes: No enlarged mediastinal, hilar, or axillary lymph nodes. Thyroid gland, trachea, and esophagus demonstrate no significant findings. Lungs/Pleura: No pneumothorax or pleural effusion is noted. Patchy airspace opacities are noted bilaterally consistent with multifocal pneumonia. Upper Abdomen: Multiple small low densities are noted in the liver parenchyma most likely representing cysts. Musculoskeletal: No chest wall abnormality. No acute or significant osseous findings. Review of the MIP images confirms the above findings. IMPRESSION: 1. No definite evidence of pulmonary embolus. 2. Patchy airspace opacities  are noted bilaterally consistent with multifocal pneumonia. 3. Multiple small low densities are noted in the liver parenchyma most likely representing cysts. Electronically Signed   By: Lupita RaiderJames  Green Jr M.D.   On: 09/09/2020 20:02   DG Chest Portable 1 View  Result Date: 09/09/2020 CLINICAL DATA:  COVID positive with headache and fever. EXAM: PORTABLE CHEST 1 VIEW COMPARISON:  None. FINDINGS: Moderate severity multifocal infiltrates are seen within the mid and lower lung fields, bilaterally. There is no evidence of a pleural effusion or pneumothorax. The heart size and mediastinal contours are within normal limits. The visualized skeletal structures are unremarkable. IMPRESSION: Moderate severity bilateral multifocal infiltrates. Electronically Signed   By: Aram Candelahaddeus  Houston M.D.   On: 09/09/2020 17:19     CODE STATUS:     Code Status Orders  (From admission, onward)         Start     Ordered   09/09/20 2015  Full code  Continuous        09/09/20 2016        Code Status History    This patient has a current code status but no historical code status.   Advance Care Planning Activity       TOTAL TIME TAKING CARE OF  THIS PATIENT: *35* minutes.    Enedina Finner M.D  [facility]    CC: Primary care physician; Patient, No Pcp Per

## 2020-09-10 NOTE — Progress Notes (Signed)
Patient is being discharged home.  Discharge papers given and explained to pt.  Pt verbalized understanding.  Meds and f/u appointments reviewed.  Rx sent electronically to pharmacy. Pt made aware.  

## 2020-09-10 NOTE — Discharge Instructions (Addendum)
Patient scheduled for outpatient Remdesivir infusions at 2:30 on Monday 1/17, Tuesday 1/18 and Wednesday 1/19 at [facility]. Please inform the patient to park at [address] [place], [place], as staff will be escorting the patient through the east entrance of the hospital. Appointments take approximately 45 minutes.    There is a wave flag banner located near the entrance on [address]. Turn into this entrance and immediately turn left or right and park in 1 of the 10 designated Covid Infusion Parking spots. There is a phone number on the sign, please call and let the staff know what spot you are in and we will come out and get you. For questions call [phone].  Thanks.  Please note if we are closed on 1/17 due to weather, you will be notified and rescheduled.    USE YOUR INCENTIVE SPIROMETER AND FLUTTER VALVE. USe your inhaler as instrcuted

## 2020-09-10 NOTE — ED Notes (Addendum)
Lab called advising this RN that covid test is positive. Inpatient RN Jamesetta So made aware and advised pt ok to come to inpatient room.

## 2020-09-10 NOTE — ED Notes (Signed)
Inpatient RN made this RN aware that pt needs positive covid test to be accepted to floor upstairs. MD Jon Billings and Biomedical engineer aware.

## 2020-09-10 NOTE — Progress Notes (Signed)
Patient requested to go home.  Dr Allena Katz made aware.

## 2020-09-10 NOTE — Progress Notes (Addendum)
Patient scheduled for outpatient Remdesivir infusions at 2:30 on Monday 1/17, Tuesday 1/18 and Wednesday 1/19 at [facility]. Please inform the patient to park at [address] [place], [place], as staff will be escorting the patient through the east entrance of the hospital. Appointments take approximately 45 minutes.    There is a wave flag banner located near the entrance on [address]. Turn into this entrance and immediately turn left or right and park in 1 of the 10 designated Covid Infusion Parking spots. There is a phone number on the sign, please call and let the staff know what spot you are in and we will come out and get you. For questions call [phone].  Thanks.  Please note if we are closed on 1/17 due to weather, you will be notified and rescheduled.

## 2020-09-12 ENCOUNTER — Ambulatory Visit (HOSPITAL_COMMUNITY): Payer: 59

## 2020-09-13 ENCOUNTER — Ambulatory Visit (HOSPITAL_COMMUNITY): Payer: 59

## 2020-09-13 ENCOUNTER — Ambulatory Visit (HOSPITAL_COMMUNITY)
Admission: RE | Admit: 2020-09-13 | Discharge: 2020-09-13 | Disposition: A | Discharge status: Home / Self Care | Payer: 59 | Source: Ambulatory Visit | Attending: Pulmonary Disease | Admitting: Pulmonary Disease

## 2020-09-13 DIAGNOSIS — U071 COVID-19: Secondary | ICD-10-CM | POA: Diagnosis present

## 2020-09-13 DIAGNOSIS — J1282 Pneumonia due to coronavirus disease 2019: Secondary | ICD-10-CM | POA: Insufficient documentation

## 2020-09-13 MED ORDER — ALBUTEROL SULFATE HFA 108 (90 BASE) MCG/ACT IN AERS: 2.0000 | INHALATION_SPRAY | Freq: Once | RESPIRATORY_TRACT | Status: DC | PRN

## 2020-09-13 MED ORDER — SODIUM CHLORIDE 0.9 % IV SOLN
100.0000 mg | Freq: Once | INTRAVENOUS | Status: AC
  Administered 2020-09-13: 100 mg via INTRAVENOUS

## 2020-09-13 MED ORDER — DIPHENHYDRAMINE HCL 50 MG/ML IJ SOLN: 50.0000 mg | Freq: Once | INTRAMUSCULAR | Status: DC | PRN

## 2020-09-13 MED ORDER — EPINEPHRINE 0.3 MG/0.3ML IJ SOAJ: 0.3000 mg | Freq: Once | INTRAMUSCULAR | Status: DC | PRN

## 2020-09-13 MED ORDER — METHYLPREDNISOLONE SODIUM SUCC 125 MG IJ SOLR: 125.0000 mg | Freq: Once | INTRAMUSCULAR | Status: DC | PRN

## 2020-09-13 MED ORDER — FAMOTIDINE IN NACL 20-0.9 MG/50ML-% IV SOLN: 20.0000 mg | Freq: Once | INTRAVENOUS | Status: DC | PRN

## 2020-09-13 MED ORDER — SODIUM CHLORIDE 0.9 % IV SOLN: INTRAVENOUS | Status: DC | PRN

## 2020-09-13 NOTE — Progress Notes (Signed)
  Diagnosis: COVID-19  Physician: Shan Levans, MD  Procedure: Covid Infusion Clinic Med: remdesivir infusion - Provided patient with remdesivir fact sheet for patients, parents and caregivers prior to infusion.  Complications: No immediate complications noted.  Discharge: Discharged home   Angelia Mould 09/13/2020

## 2020-09-13 NOTE — Discharge Instructions (Signed)
10 Things You Can Do to Manage Your COVID-19 Symptoms at Home °If you have possible or confirmed COVID-19: °1. Stay home except to get medical care. °2. Monitor your symptoms carefully. If your symptoms get worse, call your healthcare provider immediately. °3. Get rest and stay hydrated. °4. If you have a medical appointment, call the healthcare provider ahead of time and tell them that you have or may have COVID-19. °5. For medical emergencies, call 911 and notify the dispatch personnel that you have or may have COVID-19. °6. Cover your cough and sneezes with a tissue or use the inside of your elbow. °7. Wash your hands often with soap and water for at least 20 seconds or clean your hands with an alcohol-based hand sanitizer that contains at least 60% alcohol. °8. As much as possible, stay in a specific room and away from other people in your home. Also, you should use a separate bathroom, if available. If you need to be around other people in or outside of the home, wear a mask. °9. Avoid sharing personal items with other people in your household, like dishes, towels, and bedding. °10. Clean all surfaces that are touched often, like counters, tabletops, and doorknobs. Use household cleaning sprays or wipes according to the label instructions. °cdc.gov/coronavirus °03/11/2020 °This information is not intended to replace advice given to you by your health care provider. Make sure you discuss any questions you have with your health care provider. °Document Revised: 06/27/2020 Document Reviewed: 06/27/2020 °Elsevier Patient Education © 2021 Elsevier Inc. °If you have any questions or concerns after the infusion please call the Advanced Practice Provider on call at [phone]. This number is ONLY intended for your use regarding questions or concerns about the infusion post-treatment side-effects.  Please do not provide this number to others for use. For return to work notes please contact your primary care provider.   ° °If someone you know is interested in receiving treatment please have them contact their MD for a referral or visit [email or web address] ° ° ° °

## 2020-09-14 ENCOUNTER — Ambulatory Visit (HOSPITAL_COMMUNITY): Payer: 59

## 2020-09-14 ENCOUNTER — Ambulatory Visit (HOSPITAL_COMMUNITY)
Admission: RE | Admit: 2020-09-14 | Discharge: 2020-09-14 | Disposition: A | Discharge status: Home / Self Care | Payer: 59 | Source: Ambulatory Visit | Attending: Pulmonary Disease | Admitting: Pulmonary Disease

## 2020-09-14 DIAGNOSIS — U071 COVID-19: Secondary | ICD-10-CM | POA: Insufficient documentation

## 2020-09-14 DIAGNOSIS — J1282 Pneumonia due to coronavirus disease 2019: Secondary | ICD-10-CM | POA: Diagnosis not present

## 2020-09-14 MED ORDER — FAMOTIDINE IN NACL 20-0.9 MG/50ML-% IV SOLN: 20.0000 mg | Freq: Once | INTRAVENOUS | Status: DC | PRN

## 2020-09-14 MED ORDER — EPINEPHRINE 0.3 MG/0.3ML IJ SOAJ: 0.3000 mg | Freq: Once | INTRAMUSCULAR | Status: DC | PRN

## 2020-09-14 MED ORDER — METHYLPREDNISOLONE SODIUM SUCC 125 MG IJ SOLR: 125.0000 mg | Freq: Once | INTRAMUSCULAR | Status: DC | PRN

## 2020-09-14 MED ORDER — SODIUM CHLORIDE 0.9 % IV SOLN: INTRAVENOUS | Status: DC | PRN

## 2020-09-14 MED ORDER — SODIUM CHLORIDE 0.9 % IV SOLN
100.0000 mg | Freq: Once | INTRAVENOUS | Status: AC
  Administered 2020-09-14: 100 mg via INTRAVENOUS

## 2020-09-14 MED ORDER — SODIUM CHLORIDE 0.9 % IV SOLN: 100.0000 mg | Freq: Once | INTRAVENOUS | Status: DC

## 2020-09-14 MED ORDER — DIPHENHYDRAMINE HCL 50 MG/ML IJ SOLN: 50.0000 mg | Freq: Once | INTRAMUSCULAR | Status: DC | PRN

## 2020-09-14 MED ORDER — ALBUTEROL SULFATE HFA 108 (90 BASE) MCG/ACT IN AERS: 2.0000 | INHALATION_SPRAY | Freq: Once | RESPIRATORY_TRACT | Status: DC | PRN

## 2020-09-14 NOTE — Discharge Instructions (Signed)
10 Things You Can Do to Manage Your COVID-19 Symptoms at Home °If you have possible or confirmed COVID-19: °1. Stay home except to get medical care. °2. Monitor your symptoms carefully. If your symptoms get worse, call your healthcare provider immediately. °3. Get rest and stay hydrated. °4. If you have a medical appointment, call the healthcare provider ahead of time and tell them that you have or may have COVID-19. °5. For medical emergencies, call 911 and notify the dispatch personnel that you have or may have COVID-19. °6. Cover your cough and sneezes with a tissue or use the inside of your elbow. °7. Wash your hands often with soap and water for at least 20 seconds or clean your hands with an alcohol-based hand sanitizer that contains at least 60% alcohol. °8. As much as possible, stay in a specific room and away from other people in your home. Also, you should use a separate bathroom, if available. If you need to be around other people in or outside of the home, wear a mask. °9. Avoid sharing personal items with other people in your household, like dishes, towels, and bedding. °10. Clean all surfaces that are touched often, like counters, tabletops, and doorknobs. Use household cleaning sprays or wipes according to the label instructions. °cdc.gov/coronavirus °03/11/2020 °This information is not intended to replace advice given to you by your health care provider. Make sure you discuss any questions you have with your health care provider. °Document Revised: 06/27/2020 Document Reviewed: 06/27/2020 °Elsevier Patient Education © 2021 Elsevier Inc. °If you have any questions or concerns after the infusion please call the Advanced Practice Provider on call at [phone]. This number is ONLY intended for your use regarding questions or concerns about the infusion post-treatment side-effects.  Please do not provide this number to others for use. For return to work notes please contact your primary care provider.   ° °If someone you know is interested in receiving treatment please have them contact their MD for a referral or visit [email or web address] ° ° ° °

## 2020-09-14 NOTE — Progress Notes (Signed)
  Diagnosis: COVID-19  Physician: Dr. Shan Levans  Procedure: Covid Infusion Clinic Med: remdesivir infusion - Provided patient with remdesivir fact sheet for patients, parents and caregivers prior to infusion.  Complications: No immediate complications noted.  Discharge: Discharged home   Gregary Signs 09/14/2020

## 2020-09-15 ENCOUNTER — Ambulatory Visit (HOSPITAL_COMMUNITY)
Admission: RE | Admit: 2020-09-15 | Discharge: 2020-09-15 | Disposition: A | Discharge status: Home / Self Care | Payer: 59 | Source: Ambulatory Visit | Attending: Pulmonary Disease | Admitting: Pulmonary Disease

## 2020-09-15 DIAGNOSIS — J1282 Pneumonia due to coronavirus disease 2019: Secondary | ICD-10-CM | POA: Diagnosis not present

## 2020-09-15 DIAGNOSIS — U071 COVID-19: Secondary | ICD-10-CM | POA: Diagnosis present

## 2020-09-15 MED ORDER — ALBUTEROL SULFATE HFA 108 (90 BASE) MCG/ACT IN AERS: 2.0000 | INHALATION_SPRAY | Freq: Once | RESPIRATORY_TRACT | Status: DC | PRN

## 2020-09-15 MED ORDER — SODIUM CHLORIDE 0.9 % IV SOLN
100.0000 mg | Freq: Once | INTRAVENOUS | Status: AC
  Administered 2020-09-15: 100 mg via INTRAVENOUS

## 2020-09-15 MED ORDER — EPINEPHRINE 0.3 MG/0.3ML IJ SOAJ: 0.3000 mg | Freq: Once | INTRAMUSCULAR | Status: DC | PRN

## 2020-09-15 MED ORDER — FAMOTIDINE IN NACL 20-0.9 MG/50ML-% IV SOLN: 20.0000 mg | Freq: Once | INTRAVENOUS | Status: DC | PRN

## 2020-09-15 MED ORDER — METHYLPREDNISOLONE SODIUM SUCC 125 MG IJ SOLR: 125.0000 mg | Freq: Once | INTRAMUSCULAR | Status: DC | PRN

## 2020-09-15 MED ORDER — DIPHENHYDRAMINE HCL 50 MG/ML IJ SOLN: 50.0000 mg | Freq: Once | INTRAMUSCULAR | Status: DC | PRN

## 2020-09-15 MED ORDER — SODIUM CHLORIDE 0.9 % IV SOLN: INTRAVENOUS | Status: DC | PRN

## 2020-09-15 NOTE — Progress Notes (Signed)
Patient reviewed Fact Sheet for Patients, Parents, and Caregivers for Emergency Use Authorization (EUA) of sotrovimab for the Treatment of Coronavirus. Patient also reviewed and is agreeable to the estimated cost of treatment. Patient is agreeable to proceed.   

## 2020-09-15 NOTE — Progress Notes (Signed)
  Diagnosis: COVID-19  Physician: Dr. Shan Levans  Procedure: Covid Infusion Clinic Med: remdesivir infusion - Provided patient with remdesivir fact sheet for patients, parents and caregivers prior to infusion.  Complications: No immediate complications noted.  Discharge: Discharged home   Stephen Weaver 09/15/2020

## 2020-09-15 NOTE — Discharge Instructions (Signed)
   If you have any questions or concerns after the infusion please call the Advanced Practice Provider on call at [phone]. This number is ONLY intended for your use regarding questions or concerns about the infusion post-treatment side-effects.  Please do not provide this number to others for use. For return to work notes please contact your primary care provider.   If someone you know is interested in receiving treatment please have them contact their MD for a referral or visit [email or web address]  10 Things You Can Do to Manage Your COVID-19 Symptoms at Home If you have possible or confirmed COVID-19: 1. Stay home except to get medical care. 2. Monitor your symptoms carefully. If your symptoms get worse, call your healthcare provider immediately. 3. Get rest and stay hydrated. 4. If you have a medical appointment, call the healthcare provider ahead of time and tell them that you have or may have COVID-19. 5. For medical emergencies, call 911 and notify the dispatch personnel that you have or may have COVID-19. 6. Cover your cough and sneezes with a tissue or use the inside of your elbow. 7. Wash your hands often with soap and water for at least 20 seconds or clean your hands with an alcohol-based hand sanitizer that contains at least 60% alcohol. 8. As much as possible, stay in a specific room and away from other people in your home. Also, you should use a separate bathroom, if available. If you need to be around other people in or outside of the home, wear a mask. 9. Avoid sharing personal items with other people in your household, like dishes, towels, and bedding. 10. Clean all surfaces that are touched often, like counters, tabletops, and doorknobs. Use household cleaning sprays or wipes according to the label instructions. [employer] 03/11/2020 This information is not intended to replace advice given to you by your health care provider. Make sure you discuss any  questions you have with your health care provider. Document Revised: 06/27/2020 Document Reviewed: 06/27/2020 Elsevier Patient Education  2021 [employer].

## 2020-09-21 ENCOUNTER — Telehealth: Payer: Self-pay | Admitting: Family

## 2020-09-21 NOTE — Telephone Encounter (Signed)
Received call from patient's wife to COVID19 treatment after hours number.  Mr. Kaczmarek was admitted 09/09/20-09/10/20 for COVID19 pneumonia and was started on Remdesivir. He received his next doses of Remdesivir 09/13/20, 09/14/20, 09/15/20. 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 Tells me he has been doing light duty.   His wife shares with me that for the last few days he has been experiencing intermittent numbness to the right side of his face.   While on the phone I asked him to smile and his wife tells me his smile is even. He has no change in strength. No change in mentation nor speech. Tells me the numbness occurs more often in the afternoon and is relieved by stretching his neck.   Low suspicion for stroke as symptoms are atypical. Educated Mr. Yera wife on signs of stroke and to present to ED if these are notable. Suspect symptoms are nerve related as described as tingling and improved with rotation of the neck.  His wife shares with me that he has been anxious and has had difficulty sleeping. He requested to take Benadryl before bed which is reasonable. Encouraged to discuss with his primary care provider.   He has an upcoming appointment with his PCP Friday 09/23/20 for chest x-ray and post-COVID follow up. Will forward this note to Debbra Riding, PA for review.   Alver Sorrow, NP

## 2020-10-05 NOTE — Addendum Note (Signed)
Encounter addended by: Marilynn Rail, RN on: 10/05/2020 5:37 PM  Actions taken: Charge Capture section accepted

## 2020-10-13 ENCOUNTER — Other Ambulatory Visit: Payer: Self-pay

## 2020-10-13 DIAGNOSIS — Z302 Encounter for sterilization: Secondary | ICD-10-CM

## 2020-10-14 ENCOUNTER — Other Ambulatory Visit: Payer: 59

## 2020-10-14 ENCOUNTER — Other Ambulatory Visit: Payer: Self-pay

## 2020-10-14 DIAGNOSIS — Z302 Encounter for sterilization: Secondary | ICD-10-CM

## 2020-10-15 LAB — POST-VAS SPERM EVALUATION,QUAL: Volume: 6.3 mL

## 2020-10-18 ENCOUNTER — Telehealth: Payer: Self-pay

## 2020-10-18 NOTE — Telephone Encounter (Signed)
Called pt informed him of the information below. Pt gave verbal understanding.  

## 2020-10-18 NOTE — Telephone Encounter (Signed)
-----   Message from Sondra Come, MD sent at 10/17/2020  2:47 PM EST ----- No sperm seen, ok to discontinue alternative contraception  Legrand Rams, MD 10/17/2020

## 2022-01-26 IMAGING — CT CT ANGIO CHEST
2 of 6 series · 18 of 46 positions shown · IV contrast (APPLIED)
Comparison: Radiograph of same day.

CLINICAL DATA: [id number] pneumonia.

EXAM:
CT ANGIOGRAPHY CHEST WITH CONTRAST
TECHNIQUE: Multidetector CT imaging of the chest was performed using the
standard protocol during bolus administration of intravenous
contrast. Multiplanar CT image reconstructions and MIPs were
obtained to evaluate the vascular anatomy.
CONTRAST:  100mL OMNIPAQUE IOHEXOL 350 MG/ML SOLN

[Series 5: thins · axial · 0.68mm/px · z∈[+209,+448]mm · 15 of 263 slices shown]
[im 12/263  lung]
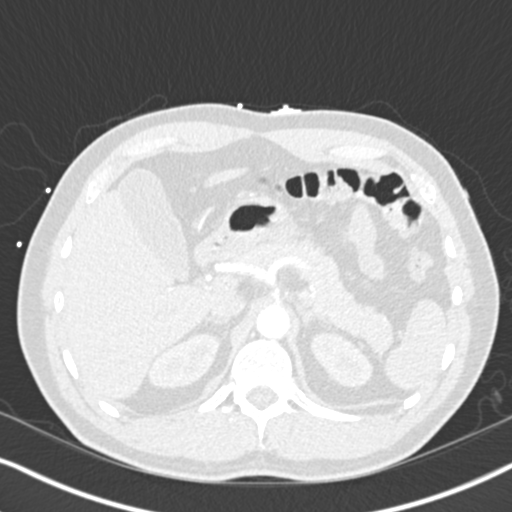
[im 35/263  soft-tissue]
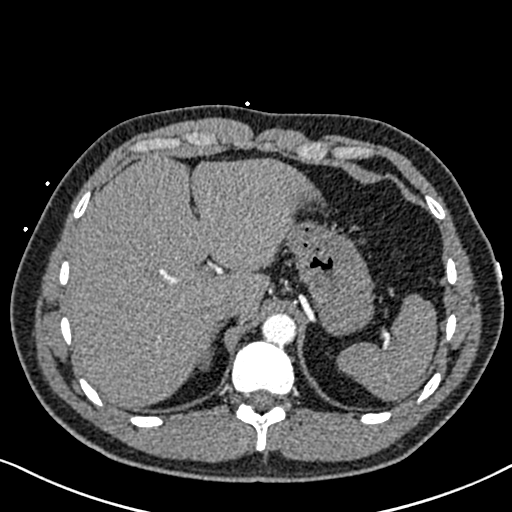
[im 46/263  lung]
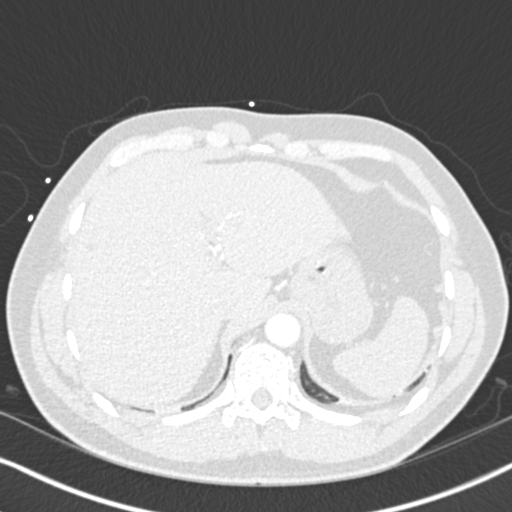
[im 69/263  soft-tissue]
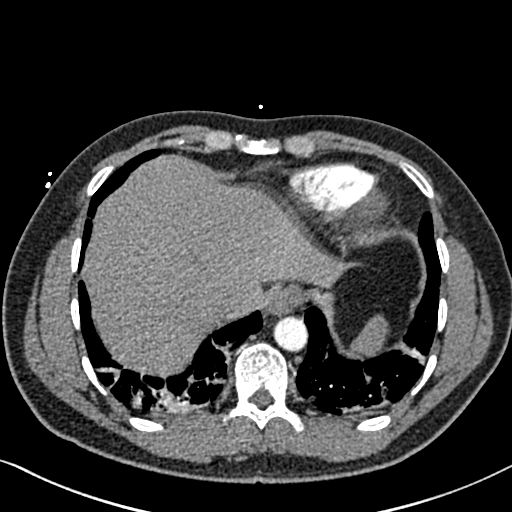
[im 80/263  lung]
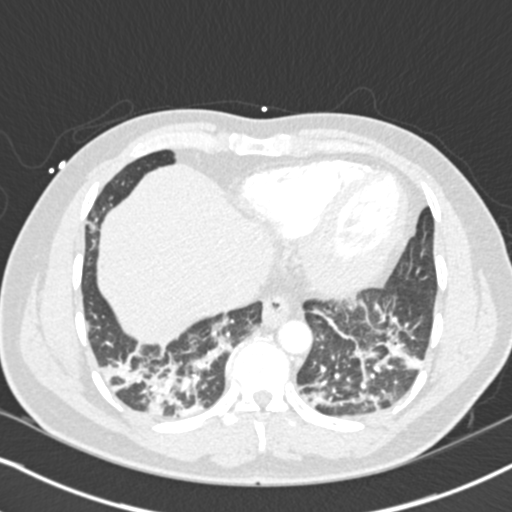
[im 103/263  soft-tissue]
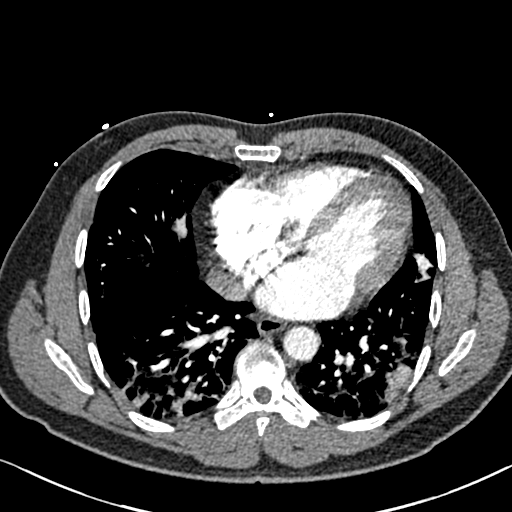
[im 114/263  lung]
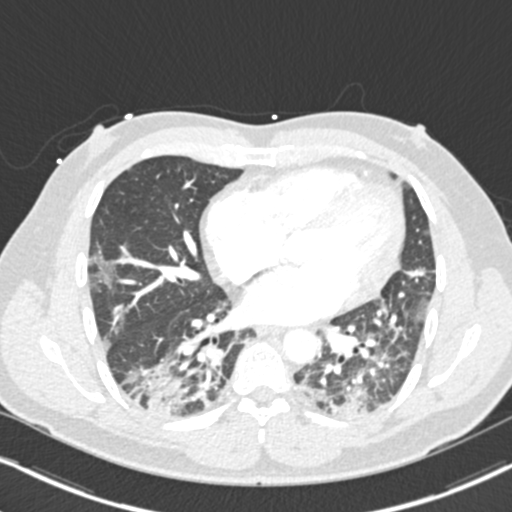
[im 137/263  soft-tissue]
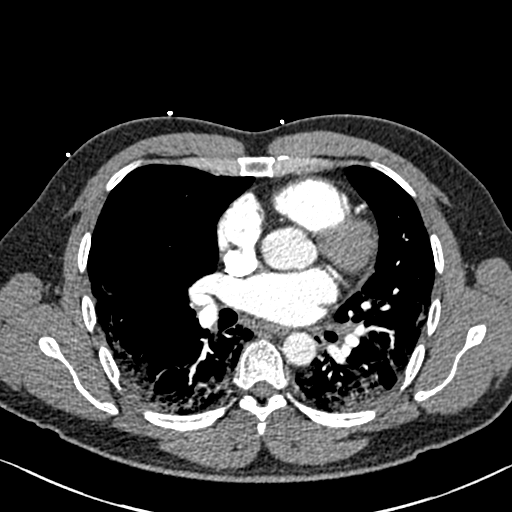
[im 149/263  lung]
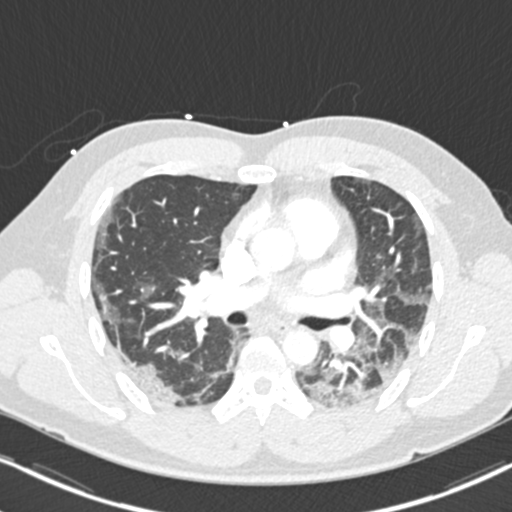
[im 160/263  soft-tissue]
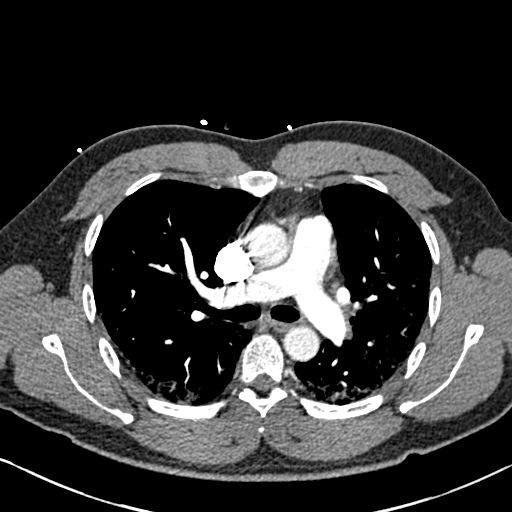
[im 183/263  lung]
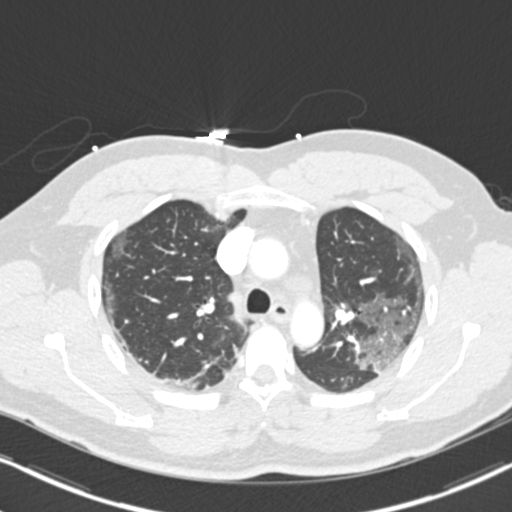
[im 194/263  soft-tissue]
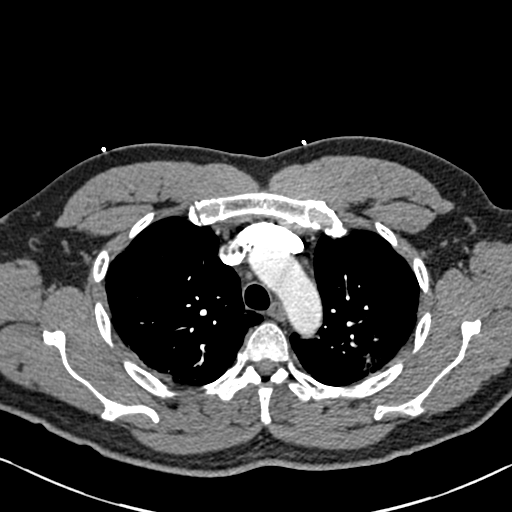
[im 217/263  lung]
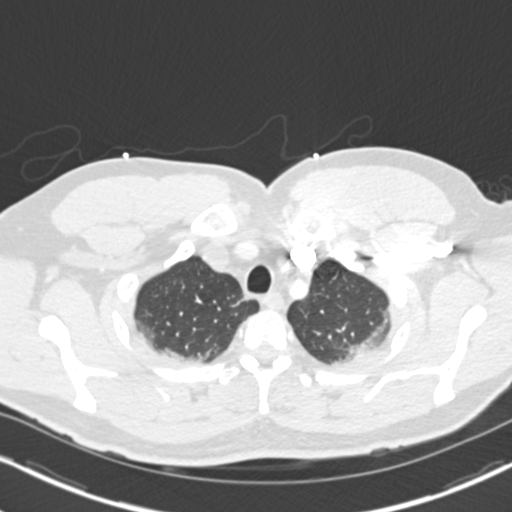
[im 228/263  soft-tissue]
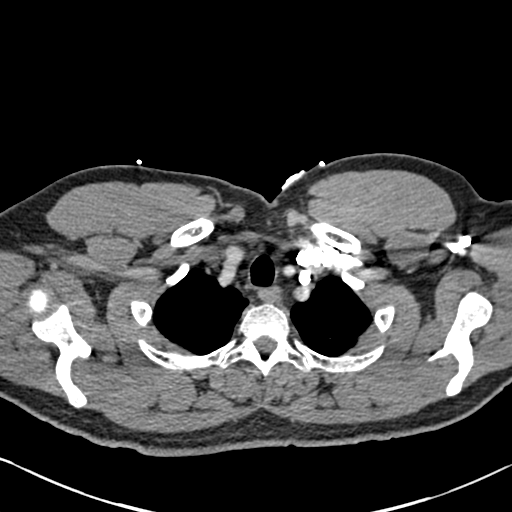
[im 251/263  lung]
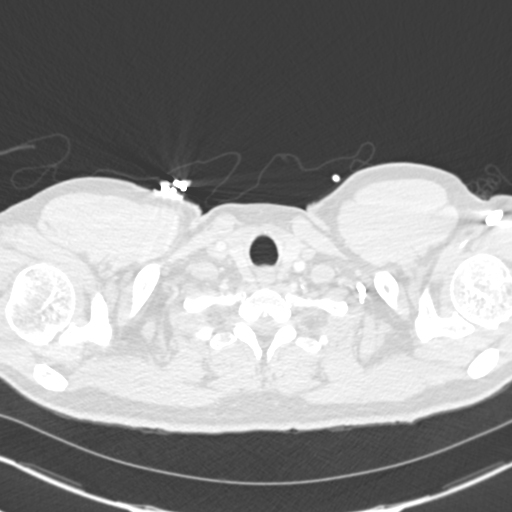

[Series 7: coronal mpr · coronal · 0.52mm/px · 3 of 84 slices shown]
[im 21/84  soft-tissue]
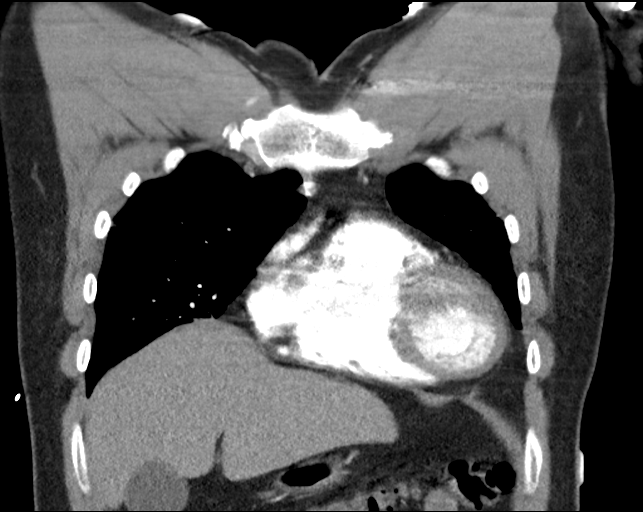
[im 42/84  soft-tissue]
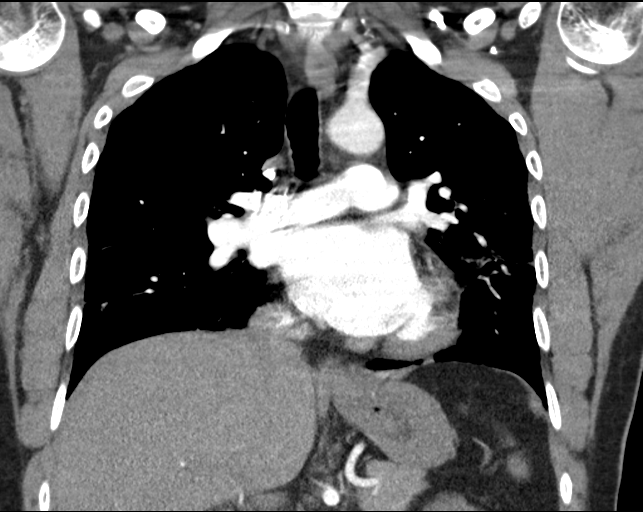
[im 63/84  soft-tissue]
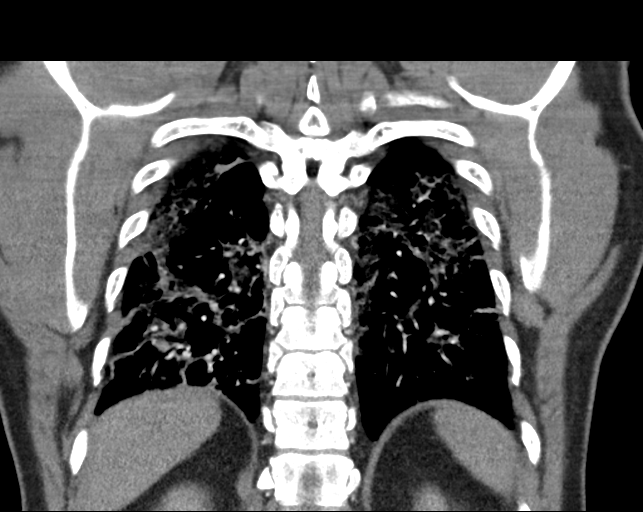

[18 of 46 positions shown; findings below may reference images not displayed]

FINDINGS: Cardiovascular: Satisfactory opacification of the pulmonary arteries
to the segmental level. No evidence of pulmonary embolism. Normal
heart size. No pericardial effusion.

Mediastinum/Nodes: No enlarged mediastinal, hilar, or axillary lymph
nodes. Thyroid gland, trachea, and esophagus demonstrate no
significant findings.

Lungs/Pleura: No pneumothorax or pleural effusion is noted. Patchy
airspace opacities are noted bilaterally consistent with multifocal
pneumonia.

Upper Abdomen: Multiple small low densities are noted in the liver
parenchyma most likely representing cysts.

Musculoskeletal: No chest wall abnormality. No acute or significant
osseous findings.

Review of the MIP images confirms the above findings.
IMPRESSION: 1. No definite evidence of pulmonary embolus.
2. Patchy airspace opacities are noted bilaterally consistent with
multifocal pneumonia.
3. Multiple small low densities are noted in the liver parenchyma
most likely representing cysts.
